# Patient Record
Sex: Female | Born: 2000 | Race: White | Hispanic: No | Marital: Single | Smoking: Never smoker
Health system: Southern US, Community
[De-identification: ages and names within clinical notes are randomized; demographics above are authoritative.]

---
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
## 2019-05-09 ENCOUNTER — Ambulatory Visit: Payer: Self-pay | Admitting: Physician Assistant

## 2019-06-06 ENCOUNTER — Encounter: Payer: Self-pay | Admitting: Physician Assistant

## 2019-06-06 ENCOUNTER — Other Ambulatory Visit: Payer: Self-pay

## 2019-06-06 ENCOUNTER — Ambulatory Visit (INDEPENDENT_AMBULATORY_CARE_PROVIDER_SITE_OTHER): Payer: Self-pay | Admitting: Physician Assistant

## 2019-06-06 DIAGNOSIS — R634 Abnormal weight loss: Secondary | ICD-10-CM

## 2019-06-06 DIAGNOSIS — F4010 Social phobia, unspecified: Secondary | ICD-10-CM

## 2019-06-06 DIAGNOSIS — F422 Mixed obsessional thoughts and acts: Secondary | ICD-10-CM

## 2019-06-06 MED ORDER — SERTRALINE HCL 50 MG PO TABS: ORAL_TABLET | ORAL | 1 refills | Status: DC

## 2019-06-06 MED ORDER — HYDROXYZINE HCL 10 MG PO TABS: 10.0000 mg | ORAL_TABLET | Freq: Three times a day (TID) | ORAL | 0 refills | Status: DC | PRN

## 2019-06-06 NOTE — Progress Notes (Signed)
[facility]  Patient ID: Virginia Bowen,  MRN: [id number]  PCP: Theresa Duty, MD  Date of Evaluation: 06/06/2019 Time spent:20 minutes  Chief Complaint:  Chief Complaint    Weight Loss      HISTORY/CURRENT STATUS: HPI Not doing well. Accompanied by mom.  Virginia Bowen was a patient of mine several years ago but has not been seen in approximately 2 years.  She was on Zoloft for anxiety and depression which was helpful.  She also took hydroxyzine for severe anxiety as needed.  She was doing well so she came off the medication.  Patient has lost about 20 # in the past year.  Has abdominal pain associated w/ n/v/d almost daily for that length of time.  Has seen her Pediatrician and then did a telehealth visit w/ GI. And has now aged out of pediatrician and is seeing Dr. Nancy Fetter as PCP at [facility]. Dr. Nancy Fetter recommended she see Psych again to make sure her problems aren't related to mental health.   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  Virginia Bowen enjoys having fun with family and friends.  She does not like to stay in bed or sleep all the time.  She likes to be busy.  She has always been an easy crier but no more so now than before.  Denies suicidal or homicidal thoughts.  Patient likes things to be in order.  She will often go back into the house to make sure that the flat iron has been turned off or that windows are locked, things of that nature.  It does not take a lot of time out of her day but it is important to her that she recheck those things.  She likes doing things in order.  She ruminates about things.  Her boyfriend tells her that she "keeps beating a dead horse."  She cannot let things go.  Gets more anxious when she has to be around a lot of people, even people she knows.  Patient denies increased energy with decreased need for sleep, no increased talkativeness, no racing thoughts, no impulsivity  or risky behaviors, no increased spending, no increased libido, no grandiosity, no increased irritability or anger, and no hallucinations.  Review of Systems  Constitutional: Positive for weight loss.  HENT: Negative.   Eyes: Negative.   Respiratory: Negative.   Cardiovascular: Negative.   Gastrointestinal: Positive for abdominal pain, diarrhea, nausea and vomiting.  Genitourinary: Negative.   Musculoskeletal: Negative.   Skin: Negative.   Neurological: Negative.   Endo/Heme/Allergies: Negative.   Psychiatric/Behavioral: The patient is nervous/anxious.    Individual Medical History/ Review of Systems: Changes? :Yes  See above.  Past medications for mental health diagnoses include: Zoloft, Hydroxyzine  Allergies: Patient has no known allergies.  Current Medications:  Current Outpatient Medications:  .  lactobacillus acidophilus (BACID) TABS tablet, Take 2 tablets by mouth 3 (three) times daily., Disp: , Rfl:  .  ondansetron (ZOFRAN) 4 MG tablet, 8 mg every 8 (eight) hours as needed. , Disp: , Rfl: 0 .  famotidine (PEPCID) 20 MG tablet, Take 1 tablet (20 mg total) by mouth 2 (two) times daily. (Patient not taking: Reported on 06/06/2019), Disp: 60 tablet, Rfl: 1 .  hydrOXYzine (ATARAX/VISTARIL) 10 MG tablet, Take 1-2 tablets (10-20 mg total) by mouth 3 (three) times daily as needed., Disp: 60 tablet, Rfl: 0 .  polyethylene glycol  powder (GLYCOLAX/MIRALAX) powder, Use as directed by md (Patient not taking: Reported on 06/06/2019), Disp: 500 g, Rfl: 0 .  sertraline (ZOLOFT) 50 MG tablet, 1/2 po q am for 6 days, then 1 po q am., Disp: 30 tablet, Rfl: 1 Medication Side Effects: none  Family Medical/ Social History: Changes? No  MENTAL HEALTH EXAM:  There were no vitals taken for this visit.There is no height or weight on file to calculate BMI.  General Appearance: Casual, Neat and Well Groomed  Eye Contact:  Good  Speech:  Clear and Coherent and Normal Rate  Volume:  Normal  Mood:   Anxious  Affect:  Anxious  Thought Process:  Goal Directed and Descriptions of Associations: Intact  Orientation:  Full (Time, Place, and Person)  Thought Content: Logical   Suicidal Thoughts:  No  Homicidal Thoughts:  No  Memory:  WNL  Judgement:  Good  Insight:  Good  Psychomotor Activity:  Fidgety  Concentration:  Concentration: Good  Recall:  Good  Fund of Knowledge: Good  Language: Good  Assets:  Desire for Improvement  ADL's:  Intact  Cognition: WNL  Prognosis:  Good    DIAGNOSES:    ICD-10-CM   1. Mixed obsessional thoughts and acts  F42.2   2. Social anxiety disorder  F40.10   3. Loss of weight  R63.4     Receiving Psychotherapy: No    RECOMMENDATIONS:  PDMP was reviewed. I spent 20 minutes with her. We discussed OCD and the anxiety that she has.  I do believe that it is contributing to her nausea, vomiting, diarrhea, and weight loss but how much of a contribution, I cannot say.  We do know that because there are serotonin receptors not only in the brain but elsewhere in the body that GI symptoms like she is experiencing can occur due to that dysregulation.  It is not that she is just "wound up or tired all the time." I recommend that we either restart the Zoloft which we at least know she tolerated well when she was on it, although she was not having these physical symptoms at that time, or start Luvox which is FDA approved for OCD.  They work the same in my experience.  The Luvox tends to make people drowsy and she does not need that side effect.  We discussed the benefits, side effects, and risks again and we all agreed to start the Zoloft. Restart Zoloft 50 mg, one half p.o. every morning for 6 days then 1 p.o. every morning. Restart hydroxyzine 10 mg, 1-2 p.o. 3 times daily as needed anxiety or sleep. Recommend counseling. She will follow-up with her PCP and GI if recommended depending on how well she responds to this treatment. Return in 4 to 6 weeks.   Melony Overly, PA-C

## 2019-06-30 ENCOUNTER — Other Ambulatory Visit: Payer: Self-pay | Admitting: Physician Assistant

## 2019-07-18 ENCOUNTER — Other Ambulatory Visit: Payer: Self-pay

## 2019-07-18 ENCOUNTER — Encounter (INDEPENDENT_AMBULATORY_CARE_PROVIDER_SITE_OTHER): Payer: Self-pay

## 2019-07-18 ENCOUNTER — Ambulatory Visit (INDEPENDENT_AMBULATORY_CARE_PROVIDER_SITE_OTHER): Payer: Self-pay | Admitting: Physician Assistant

## 2019-07-18 ENCOUNTER — Encounter: Payer: Self-pay | Admitting: Physician Assistant

## 2019-07-18 DIAGNOSIS — F4010 Social phobia, unspecified: Secondary | ICD-10-CM

## 2019-07-18 DIAGNOSIS — F422 Mixed obsessional thoughts and acts: Secondary | ICD-10-CM

## 2019-07-18 NOTE — Progress Notes (Signed)
[facility]  Patient ID: Virginia Bowen,  MRN: [id number]  PCP: Virginia Pippin, MD  Date of Evaluation: 07/18/2019 Time spent:20 minutes  Chief Complaint:  Chief Complaint    Follow-up      HISTORY/CURRENT STATUS: HPI for routine med check.  At the last visit on 06/06/2019, we restarted Zoloft.  She feels like it has helped, especially with the stomach symptoms.  She is not nearly as nauseated and having any vomiting now, and diarrhea has almost resolved.  She is eating much better and feels like she might have gained a few pounds back.  Emotionally, it is hard to tell whether it is helping much yet or not.  She thinks so but because of circumstances in her family, she cannot say for certain.  Her father was just diagnosed with colon cancer so understandably there is concern about that.  Virginia Bowen's brother just had a baby so that is been joyful change.  Still has some symptoms of OCD but does not think it is as bad.  Hydroxyzine does help but she is only taken a few.  No side effects from medications that she is aware of.  She is able to enjoy things.  Energy and motivation are good.  She is not crying easily.  Sleeps fine.  No SI/HI.  Patient denies increased energy with decreased need for sleep, no increased talkativeness, no racing thoughts, no impulsivity or risky behaviors, no increased spending, no increased libido, no grandiosity, no increased irritability or anger, and no hallucinations.  Denies dizziness, syncope, seizures, numbness, tingling, tremor, tics, unsteady gait, slurred speech, confusion. Denies muscle or joint pain, stiffness, or dystonia.  Individual Medical History/ Review of Systems: Changes? :No    Past medications for mental health diagnoses include: Zoloft, Hydroxyzine  Allergies: Patient has no known allergies.  Current Medications:  Current Outpatient Medications:  .  hydrOXYzine (ATARAX/VISTARIL) 10 MG tablet, Take 1-2 tablets (10-20  mg total) by mouth 3 (three) times daily as needed., Disp: 60 tablet, Rfl: 0 .  ondansetron (ZOFRAN) 4 MG tablet, 8 mg every 8 (eight) hours as needed. , Disp: , Rfl: 0 .  sertraline (ZOLOFT) 50 MG tablet, TAKE 1TAB DAILY, Disp: 90 tablet, Rfl: 0 .  famotidine (PEPCID) 20 MG tablet, Take 1 tablet (20 mg total) by mouth 2 (two) times daily. (Patient not taking: Reported on 06/06/2019), Disp: 60 tablet, Rfl: 1 .  lactobacillus acidophilus (BACID) TABS tablet, Take 2 tablets by mouth 3 (three) times daily. (Patient not taking: Reported on 07/18/2019), Disp: , Rfl:  .  polyethylene glycol powder (GLYCOLAX/MIRALAX) powder, Use as directed by md (Patient not taking: Reported on 06/06/2019), Disp: 500 g, Rfl: 0 Medication Side Effects: none  Family Medical/ Social History: Changes? Yes see HPI  MENTAL HEALTH EXAM:  There were no vitals taken for this visit.There is no height or weight on file to calculate BMI.  General Appearance: Casual, Neat and Well Groomed  Eye Contact:  Good  Speech:  Clear and Coherent and Normal Rate  Volume:  Normal  Mood:  Euthymic  Affect:  Appropriate  Thought Process:  Goal Directed and Descriptions of Associations: Intact  Orientation:  Full (Time, Place, and Person)  Thought Content: Logical   Suicidal Thoughts:  No  Homicidal Thoughts:  No  Memory:  WNL  Judgement:  Good  Insight:  Good  Psychomotor Activity:  Normal  Concentration:  Concentration: Good  Recall:  Good  Fund of Knowledge: Good  Language: Good  Assets:  Desire for Improvement  ADL's:  Intact  Cognition: WNL  Prognosis:  Good    DIAGNOSES:    ICD-10-CM   1. Mixed obsessional thoughts and acts  F42.2   2. Social anxiety disorder  F40.10     Receiving Psychotherapy: No    RECOMMENDATIONS:  PDMP was reviewed. I am glad she is doing better overall.  At least the physical symptoms have subsided.  Understand that with the stressors of her father's new diagnosis and having a new baby in the  family can make it hard to know whether the medications are helping a lot or not.  We decided to leave the dose of the Zoloft the same for now but if she is not seeing even more improvement within 2 weeks, call and will increase it to 75 mg.  She understands. Continue Zoloft 50 mg, 1 p.o. daily. Continue hydroxyzine 10 mg, 1-2 3 times daily as needed anxiety. Refer to [employer]. Return in 6 to 8 weeks.  Virginia Moat, PA-C

## 2019-09-02 ENCOUNTER — Ambulatory Visit (INDEPENDENT_AMBULATORY_CARE_PROVIDER_SITE_OTHER): Payer: Self-pay | Admitting: Physician Assistant

## 2019-09-02 ENCOUNTER — Encounter: Payer: Self-pay | Admitting: Physician Assistant

## 2019-09-02 ENCOUNTER — Other Ambulatory Visit: Payer: Self-pay

## 2019-09-02 DIAGNOSIS — F422 Mixed obsessional thoughts and acts: Secondary | ICD-10-CM

## 2019-09-02 DIAGNOSIS — F4010 Social phobia, unspecified: Secondary | ICD-10-CM

## 2019-09-02 MED ORDER — SERTRALINE HCL 50 MG PO TABS: 75.0000 mg | ORAL_TABLET | Freq: Every day | ORAL | 0 refills | Status: DC

## 2019-09-02 NOTE — Progress Notes (Signed)
[facility]  Patient ID: Virginia Bowen,  MRN: [id number]  PCP: Bjorn Pippin, MD  Date of Evaluation: 09/02/2019 Time spent:20 minutes  Chief Complaint:  Chief Complaint    Anxiety      HISTORY/CURRENT STATUS: HPI for routine med check.  Is doing better since being on the Zoloft but feels like she could do even better.  The OCD symptoms are not as big of a deal.  It was never serious anyway.  She will just kind of scanning the room, checking to make sure that all the windows are closed and locked, things like that.  But when she is at a client's house dog sitting, she will double check several times to make sure that the home is locked and the gate is locked so that her pets will not get out.  She is more able to begin public and not be as anxious.  Still feels like she could do better though.  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  Hydroxyzine does help but she is only taken a few.  No side effects from medications that she is aware of.  She is able to enjoy things.  Energy and motivation are good.  She is not crying easily.  Sleeps fine.  No SI/HI.  Patient denies increased energy with decreased need for sleep, no increased talkativeness, no racing thoughts, no impulsivity or risky behaviors, no increased spending, no increased libido, no grandiosity, no increased irritability or anger, and no hallucinations.  Denies dizziness, syncope, seizures, numbness, tingling, tremor, tics, unsteady gait, slurred speech, confusion. Denies muscle or joint pain, stiffness, or dystonia.  Individual Medical History/ Review of Systems: Changes? :No    Past medications for mental health diagnoses include: Zoloft, Hydroxyzine  Allergies: Patient has no known allergies.  Current Medications:  Current Outpatient Medications:    hydrOXYzine (ATARAX/VISTARIL) 10 MG tablet, Take 1-2 tablets (10-20 mg total) by mouth 3 (three)  times daily as needed., Disp: 60 tablet, Rfl: 0   sertraline (ZOLOFT) 50 MG tablet, Take 1.5 tablets (75 mg total) by mouth daily., Disp: 135 tablet, Rfl: 0   famotidine (PEPCID) 20 MG tablet, Take 1 tablet (20 mg total) by mouth 2 (two) times daily. (Patient not taking: Reported on 06/06/2019), Disp: 60 tablet, Rfl: 1   ondansetron (ZOFRAN) 4 MG tablet, 8 mg every 8 (eight) hours as needed.  (Patient not taking: Reported on 09/02/2019), Disp: , Rfl: 0   polyethylene glycol powder (GLYCOLAX/MIRALAX) powder, Use as directed by md (Patient not taking: Reported on 06/06/2019), Disp: 500 g, Rfl: 0 Medication Side Effects: none  Family Medical/ Social History: Changes? Yes see HPI  MENTAL HEALTH EXAM:  There were no vitals taken for this visit.There is no height or weight on file to calculate BMI.  General Appearance: Casual, Neat and Well Groomed  Eye Contact:  Good  Speech:  Clear and Coherent and Normal Rate  Volume:  Normal  Mood:  Euthymic  Affect:  Appropriate  Thought Process:  Goal Directed and Descriptions of Associations: Intact  Orientation:  Full (Time, Place, and Person)  Thought Content: Logical   Suicidal Thoughts:  No  Homicidal Thoughts:  No  Memory:  WNL  Judgement:  Good  Insight:  Good  Psychomotor Activity:  Normal  Concentration:  Concentration: Good and Attention Span: Good  Recall:  Good  Fund of Knowledge: Good  Language: Good  Assets:  Desire for  Improvement  ADL's:  Intact  Cognition: WNL  Prognosis:  Good    DIAGNOSES:    ICD-10-CM   1. Social anxiety disorder  F40.10   2. Mixed obsessional thoughts and acts  F42.2     Receiving Psychotherapy: No    RECOMMENDATIONS:  PDMP was reviewed. Provided 20 minutes of face-to-face time during this encounter. We discussed increasing the Zoloft.  She states she is approximately 50% better than before going on the Zoloft.  I believe we can get more improvement than that.  She would like to try the  increase. We also discussed counseling.  She does not know what she would talk about with a counselor so prefers not to go right now.  States she does not have any tragedies or major stressors going on so she has nothing to discuss.  If that changes, she will seek a help of a counselor. Increase Zoloft 50 mg, to 1.5 pills daily. Continue hydroxyzine 10 mg, 1-2 3 times daily as needed anxiety. Return in 6 weeks.  Melony Overly, PA-C

## 2019-09-25 ENCOUNTER — Telehealth: Payer: Self-pay | Admitting: Physician Assistant

## 2019-09-25 NOTE — Telephone Encounter (Signed)
Pt's mom Natalia Leatherwood called and Sertroline needs a dosing change to 75 mg as the pharmacy has 50 mg. She needs a refill sent to [facility] on [address], [phone].

## 2019-09-26 ENCOUNTER — Other Ambulatory Visit: Payer: Self-pay

## 2019-09-26 MED ORDER — SERTRALINE HCL 50 MG PO TABS: 75.0000 mg | ORAL_TABLET | Freq: Every day | ORAL | 0 refills | Status: DC

## 2019-09-26 NOTE — Telephone Encounter (Signed)
Rx sent today, provider had no print and he did not get sent to pharmacy previously at office visit.

## 2019-10-14 ENCOUNTER — Encounter: Payer: Self-pay | Admitting: Physician Assistant

## 2019-10-14 ENCOUNTER — Telehealth (INDEPENDENT_AMBULATORY_CARE_PROVIDER_SITE_OTHER): Payer: Self-pay | Admitting: Physician Assistant

## 2019-10-14 DIAGNOSIS — F422 Mixed obsessional thoughts and acts: Secondary | ICD-10-CM

## 2019-10-14 DIAGNOSIS — F4010 Social phobia, unspecified: Secondary | ICD-10-CM

## 2019-10-14 NOTE — Progress Notes (Signed)
[facility]  Patient ID: Virginia Bowen,  MRN: [id number]  PCP: Bjorn Pippin, MD  Date of Evaluation: 10/14/2019 Time spent:20 minutes  Chief Complaint:  Chief Complaint    Anxiety     Virtual Visit via Telehealth  I connected with patient by telephone, with their informed consent, and verified patient privacy and that I am speaking with the correct person using two identifiers.  I am private, in my office and the patient is at home.  I discussed the limitations, risks, security and privacy concerns of performing an evaluation and management service by telephone and the availability of in person appointments. I also discussed with the patient that there may be a patient responsible charge related to this service. The patient expressed understanding and agreed to proceed.   I discussed the assessment and treatment plan with the patient. The patient was provided an opportunity to ask questions and all were answered. The patient agreed with the plan and demonstrated an understanding of the instructions.   The patient was advised to call back or seek an in-person evaluation if the symptoms worsen or if the condition fails to improve as anticipated.  I provided 20 minutes of non-face-to-face time during this encounter.  HISTORY/CURRENT STATUS: HPI for 6-week med check.  At the last visit, we increased the Zoloft to help with the ruminating thoughts and mild compulsions of checking things in the home before she left, and also for social phobia.  States things are better in both areas.    She is not as nervous to go out and be around people.  She is able to enjoy things.  Energy and motivation are good.  She is not isolating.  Is excited that she will turn 19 in a few weeks and is going to the beach for her [place].  Not crying easily.  She would sleep well except for the fact that she takes care of her 2 very young nephews 2 and under and she is not getting much  sleep at night with them, or in the daytime because they are up all day.  Denies suicidal or homicidal thoughts.  Continues to have mild ruminating thoughts.  But states it is much better than before.  "I feel a ton better than I did a year ago."  She does still have to check door locks or make sure the stove is off before she feels comfortable leaving the house.  She does not spend as much time on it as she used to though.  Hydroxyzine does continue to help when needed.  She does not take it very often.  It does not make her sleepy, and just calms the anxiety down.  Patient denies increased energy with decreased need for sleep, no increased talkativeness, no racing thoughts, no impulsivity or risky behaviors, no increased spending, no increased libido, no grandiosity, no increased irritability or anger, and no hallucinations.  Denies dizziness, syncope, seizures, numbness, tingling, tremor, tics, unsteady gait, slurred speech, confusion. Denies muscle or joint pain, stiffness, or dystonia.  Individual Medical History/ Review of Systems: Changes? :No    Past medications for mental health diagnoses include: Zoloft, Hydroxyzine  Allergies: Patient has no known allergies.  Current Medications:  Current Outpatient Medications:  .  hydrOXYzine (ATARAX/VISTARIL) 10 MG tablet, Take 1-2 tablets (10-20 mg total) by mouth 3 (three) times daily as needed., Disp: 60 tablet, Rfl: 0 .  sertraline (ZOLOFT) 50 MG tablet, Take 1.5 tablets (75 mg total) by mouth daily.,  Disp: 135 tablet, Rfl: 0 .  ondansetron (ZOFRAN) 4 MG tablet, 8 mg every 8 (eight) hours as needed.  (Patient not taking: Reported on 09/02/2019), Disp: , Rfl: 0 Medication Side Effects: none  Family Medical/ Social History: Changes? Yes see HPI  MENTAL HEALTH EXAM:  There were no vitals taken for this visit.There is no height or weight on file to calculate BMI.  General Appearance: Unable to assess  Eye Contact:  Unable to assess  Speech:   Clear and Coherent and Normal Rate  Volume:  Normal  Mood:  Euthymic  Affect:  Unable to assess  Thought Process:  Goal Directed and Descriptions of Associations: Intact  Orientation:  Full (Time, Place, and Person)  Thought Content: Logical   Suicidal Thoughts:  No  Homicidal Thoughts:  No  Memory:  WNL  Judgement:  Good  Insight:  Good  Psychomotor Activity:  Unable to assess  Concentration:  Concentration: Good and Attention Span: Good  Recall:  Good  Fund of Knowledge: Good  Language: Good  Assets:  Desire for Improvement  ADL's:  Intact  Cognition: WNL  Prognosis:  Good    DIAGNOSES:    ICD-10-CM   1. Social anxiety disorder  F40.10   2. Mixed obsessional thoughts and acts  F42.2     Receiving Psychotherapy: No    RECOMMENDATIONS:  PDMP was reviewed. I provided 20 minutes of nonface-to-face time during this encounter. I am glad to see her doing so much better! We discussed the Zoloft in that the dose can be increased much more if needed.  At this point, neither of [place] feel that is necessary but she will contact the office if she feels the need before the next appointment.   Continue Zoloft 50 mg, 1.5 pills daily. Continue hydroxyzine 10 mg, 1-2 3 times daily as needed anxiety. Return in 3 months.    Melony Overly, PA-C

## 2019-12-17 ENCOUNTER — Other Ambulatory Visit: Payer: Self-pay | Admitting: Physician Assistant

## 2020-01-14 ENCOUNTER — Ambulatory Visit: Payer: Self-pay | Admitting: Physician Assistant

## 2020-03-01 ENCOUNTER — Ambulatory Visit: Payer: Self-pay | Admitting: Physician Assistant

## 2020-03-15 ENCOUNTER — Other Ambulatory Visit: Payer: Self-pay | Admitting: Physician Assistant

## 2020-04-20 ENCOUNTER — Ambulatory Visit (INDEPENDENT_AMBULATORY_CARE_PROVIDER_SITE_OTHER): Payer: Self-pay | Admitting: Physician Assistant

## 2020-04-20 ENCOUNTER — Other Ambulatory Visit: Payer: Self-pay

## 2020-04-20 ENCOUNTER — Encounter: Payer: Self-pay | Admitting: Physician Assistant

## 2020-04-20 DIAGNOSIS — F422 Mixed obsessional thoughts and acts: Secondary | ICD-10-CM

## 2020-04-20 DIAGNOSIS — N943 Premenstrual tension syndrome: Secondary | ICD-10-CM

## 2020-04-20 DIAGNOSIS — F4010 Social phobia, unspecified: Secondary | ICD-10-CM

## 2020-04-20 MED ORDER — HYDROXYZINE HCL 10 MG PO TABS: 10.0000 mg | ORAL_TABLET | Freq: Three times a day (TID) | ORAL | 5 refills | Status: DC | PRN

## 2020-04-20 NOTE — Progress Notes (Signed)
[facility]  Patient ID: Virginia Bowen,  MRN: [id number]  PCP: Bjorn Pippin, MD  Date of Evaluation: 04/20/2020 Time spent:20 minutes  Chief Complaint:  Chief Complaint    Anxiety; Depression      HISTORY/CURRENT STATUS: For routine med check.   Is doing very well.  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  She likes it but is considering changing.  She does enjoy things.  Denies decreased energy or motivation.  Appetite has not changed.  No extreme sadness, tearfulness, or feelings of hopelessness. Denies suicidal or homicidal thoughts.  Anxiety is much better.  She does need the hydroxyzine on occasion but not very often.  She can tell if she does not take the Zoloft.  Feels more anxious then.  Not having ruminating thoughts near as often as she used to.  Does report PMS, cries for a few days before menses and not sure why.  We had talked about increasing the Zoloft slightly a couple of years ago, just for the PMS.  She is not sure if she tried it or not and I do not have her old paper chart in front of me.    Patient denies increased energy with decreased need for sleep, no increased talkativeness, no racing thoughts, no impulsivity or risky behaviors, no increased spending, no increased libido, no grandiosity, no increased irritability or anger, and no hallucinations.  Denies dizziness, syncope, seizures, numbness, tingling, tremor, tics, unsteady gait, slurred speech, confusion. Denies muscle or joint pain, stiffness, or dystonia.  Individual Medical History/ Review of Systems: Changes? :No    Past medications for mental health diagnoses include: Zoloft, Hydroxyzine  Allergies: Patient has no known allergies.  Current Medications:  Current Outpatient Medications:  .  sertraline (ZOLOFT) 50 MG tablet, TAKE 1 AND 1/2 TABLETS BY MOUTH DAILY, Disp: 135 tablet, Rfl: 0 .  hydrOXYzine  (ATARAX/VISTARIL) 10 MG tablet, Take 1-2 tablets (10-20 mg total) by mouth 3 (three) times daily as needed., Disp: 60 tablet, Rfl: 5 .  ondansetron (ZOFRAN) 4 MG tablet, 8 mg every 8 (eight) hours as needed.  (Patient not taking: No sig reported), Disp: , Rfl: 0 Medication Side Effects: none  Family Medical/ Social History: Changes? Yes see HPI  MENTAL HEALTH EXAM:  There were no vitals taken for this visit.There is no height or weight on file to calculate BMI.  General Appearance: Casual, Neat and Well Groomed  Eye Contact:  Good  Speech:  Clear and Coherent and Normal Rate  Volume:  Normal  Mood:  Euthymic  Affect:  Appropriate  Thought Process:  Goal Directed and Descriptions of Associations: Intact  Orientation:  Full (Time, Place, and Person)  Thought Content: Logical   Suicidal Thoughts:  No  Homicidal Thoughts:  No  Memory:  WNL  Judgement:  Good  Insight:  Good  Psychomotor Activity:  Normal  Concentration:  Concentration: Good and Attention Span: Good  Recall:  Good  Fund of Knowledge: Good  Language: Good  Assets:  Desire for Improvement  ADL's:  Intact  Cognition: WNL  Prognosis:  Good    DIAGNOSES:    ICD-10-CM   1. Mixed obsessional thoughts and acts  F42.2   2. Social anxiety disorder  F40.10   3. PMS (premenstrual syndrome)  N94.3     Receiving Psychotherapy: No    RECOMMENDATIONS:  PDMP was reviewed. I provided 20 minutes of  face-to-face time during this encounter, including time spent in review of records and charting. For the PMS, she is aware that she can take another 25 mg of Zoloft for up to 5 days premenstrually.  Once menses begins, go back down to 75 mg daily.  She verbalizes understanding. Continue Zoloft 50 mg, 1.5 pills daily. Continue hydroxyzine 10 mg, 1-2 3 times daily as needed anxiety. Return in 6 months.    Melony Overly, PA-C

## 2020-06-19 ENCOUNTER — Other Ambulatory Visit: Payer: Self-pay | Admitting: Physician Assistant

## 2020-09-29 ENCOUNTER — Other Ambulatory Visit: Payer: Self-pay | Admitting: Physician Assistant

## 2020-10-13 ENCOUNTER — Ambulatory Visit: Payer: Self-pay | Admitting: Physician Assistant

## 2020-12-28 ENCOUNTER — Telehealth: Payer: Self-pay | Admitting: Physician Assistant

## 2020-12-28 ENCOUNTER — Other Ambulatory Visit: Payer: Self-pay

## 2020-12-28 MED ORDER — SERTRALINE HCL 50 MG PO TABS: 75.0000 mg | ORAL_TABLET | Freq: Every day | ORAL | 0 refills | Status: DC

## 2020-12-28 MED ORDER — HYDROXYZINE HCL 10 MG PO TABS: 10.0000 mg | ORAL_TABLET | Freq: Three times a day (TID) | ORAL | 0 refills | Status: DC | PRN

## 2020-12-28 NOTE — Telephone Encounter (Signed)
Rx sent 

## 2020-12-28 NOTE — Telephone Encounter (Signed)
Pt mom called and made an appt for 1/125 w/teresa. She needs refills on her zoloft and hydrooxyzine. Please send scripts to [facility] in [place]. [address]. Phone number [phone]

## 2020-12-31 ENCOUNTER — Other Ambulatory Visit: Payer: Self-pay | Admitting: Physician Assistant

## 2021-02-10 ENCOUNTER — Other Ambulatory Visit: Payer: Self-pay

## 2021-02-10 ENCOUNTER — Ambulatory Visit (INDEPENDENT_AMBULATORY_CARE_PROVIDER_SITE_OTHER): Payer: Self-pay | Admitting: Physician Assistant

## 2021-02-10 ENCOUNTER — Encounter: Payer: Self-pay | Admitting: Physician Assistant

## 2021-02-10 DIAGNOSIS — F4010 Social phobia, unspecified: Secondary | ICD-10-CM

## 2021-02-10 DIAGNOSIS — F331 Major depressive disorder, recurrent, moderate: Secondary | ICD-10-CM

## 2021-02-10 DIAGNOSIS — F422 Mixed obsessional thoughts and acts: Secondary | ICD-10-CM

## 2021-02-10 MED ORDER — TRAZODONE HCL 100 MG PO TABS: 50.0000 mg | ORAL_TABLET | Freq: Every evening | ORAL | 1 refills | Status: DC | PRN

## 2021-02-10 MED ORDER — HYDROXYZINE HCL 10 MG PO TABS: 10.0000 mg | ORAL_TABLET | Freq: Three times a day (TID) | ORAL | 1 refills | Status: DC | PRN

## 2021-02-10 NOTE — Progress Notes (Signed)
[facility]  Patient ID: Virginia Bowen,  MRN: [id number]  PCP: Theresa Duty, MD  Date of Evaluation: 02/10/2021 Time spent:20 minutes  Chief Complaint:  Chief Complaint   Anxiety; Depression; Follow-up      HISTORY/CURRENT STATUS: For routine med check. Hasn't been since since 03/2020.   Has been more anxious and depressed over the past few months. Hard time enjoying things, is tired all the time, wants to lay around, cries easily but not worse than her normal, personal hygiene is normal. 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 Feels lonely, empty, but even though she has friends and family, she doesn't want to do anything like go on dates w/ her BF for example. No SI/HI.   Not sleeping well, more of MNA than trouble falling asleep. Mostly drinks water, sometimes has 2 caffeinated beverages per day, but not right before bed. Not on electronic devices within 2 hours of going to bed.  She has tried hydroxyzine 10 mg and it does not help.  Melatonin does not help either.  Dose is unknown.  Has been more anxious, generalized sense of unease.  Not having panic attacks.  She does take the Xanax occasionally but not often.  It is helpful.  As far as the PMS goes, some months are good and others are not.  She does not always take the higher dose of Zoloft the week before.  It is hard to know when to take it.  Patient denies increased energy with decreased need for sleep, no increased talkativeness, no racing thoughts, no impulsivity or risky behaviors, no increased spending, no increased libido, no grandiosity, no increased irritability or anger, and no hallucinations.  Denies dizziness, syncope, seizures, numbness, tingling, tremor, tics, unsteady gait, slurred speech, confusion. Denies muscle or joint pain, stiffness, or dystonia.  Individual Medical History/  Review of Systems: Changes? :Yes  stomach problems.  Seeing her PCP.  Past medications for mental health diagnoses include: Zoloft, Hydroxyzine  Allergies: Patient has no known allergies.  Current Medications:  Current Outpatient Medications:    Melatonin 3-10 MG TABS, Take by mouth., Disp: , Rfl:    ondansetron (ZOFRAN) 4 MG tablet, 8 mg every 8 (eight) hours as needed., Disp: , Rfl: 0   sertraline (ZOLOFT) 50 MG tablet, Take 1.5 tablets (75 mg total) by mouth daily., Disp: 135 tablet, Rfl: 0   traZODone (DESYREL) 100 MG tablet, Take 0.5-1 tablets (50-100 mg total) by mouth at bedtime as needed for sleep., Disp: 30 tablet, Rfl: 1   hydrOXYzine (ATARAX) 10 MG tablet, Take 1-2 tablets (10-20 mg total) by mouth 3 (three) times daily as needed., Disp: 90 tablet, Rfl: 1 Medication Side Effects: none  Family Medical/ Social History: Changes? Yes see HPI  MENTAL HEALTH EXAM:  There were no vitals taken for this visit.There is no height or weight on file to calculate BMI.  General Appearance: Casual, Neat and Well Groomed  Eye Contact:  Good  Speech:  Clear and Coherent and Normal Rate  Volume:  Normal  Mood:  Depressed  Affect:  Congruent and Depressed  Thought Process:  Goal Directed and Descriptions of Associations: Intact  Orientation:  Full (Time, Place, and Person)  Thought Content: Logical   Suicidal Thoughts:  No  Homicidal Thoughts:  No  Memory:  WNL  Judgement:  Good  Insight:  Good  Psychomotor Activity:  Normal  Concentration:  Concentration: Good and Attention Span: Good  Recall:  Good  Fund of Knowledge: Good  Language: Good  Assets:  Desire for Improvement  ADL's:  Intact  Cognition: WNL  Prognosis:  Good    DIAGNOSES:    ICD-10-CM   1. Major depressive disorder, recurrent episode, moderate (HCC)  F33.1     2. Mixed obsessional thoughts and acts  F42.2     3. Social anxiety disorder  F40.10        Receiving Psychotherapy: No    RECOMMENDATIONS:   PDMP was reviewed.  No results available. I provided 20 minutes of face to face time during this encounter, including time spent before and after the visit in records review, medical decision making, counseling pertinent to today's visit, and charting.  We discussed different options and the first choice is to increase the Zoloft.  She has responded well to it in the past, it may have pooped out now.  We decided not to focus on the PMS right now but later on after we know what the Zoloft dose is going to end up being then we can increase the week before menses begins.  She verbalizes understanding. Sleep hygiene was discussed.  I recommend that she take the hydroxyzine 20 to 30 mg around 30 minutes to an hour before bed and see if that will be helpful.  If it is not then recommend trying trazodone.  I have sent that prescription in and she will get it if the increase hydroxyzine dose is ineffective.  Benefits risk and side effects of the trazodone were discussed and she accepts. Continue hydroxyzine 10 mg, 1-2 3 times daily as needed anxiety. Increase Zoloft to 100 mg for 7 to 10 days then increase to 150 mg.  She has a lot of the 50 mg pills so will take those until she needs a new prescription. Start trazodone 100 mg, 1/2-1 p.o. nightly as needed sleep. Return in 4-6 weeks.    Donnal Moat, PA-C

## 2021-02-10 NOTE — Patient Instructions (Signed)
Increase the Zoloft 50 mg to 2 pills (100 mg) for 7-10 days, then increase to 3 pills (150 mg)

## 2021-03-14 ENCOUNTER — Other Ambulatory Visit: Payer: Self-pay | Admitting: Physician Assistant

## 2021-03-14 NOTE — Telephone Encounter (Signed)
Appt on wednesday

## 2021-03-16 ENCOUNTER — Encounter: Payer: Self-pay | Admitting: Physician Assistant

## 2021-03-16 ENCOUNTER — Other Ambulatory Visit: Payer: Self-pay

## 2021-03-16 ENCOUNTER — Ambulatory Visit (INDEPENDENT_AMBULATORY_CARE_PROVIDER_SITE_OTHER): Payer: Self-pay | Admitting: Physician Assistant

## 2021-03-16 DIAGNOSIS — F4010 Social phobia, unspecified: Secondary | ICD-10-CM

## 2021-03-16 DIAGNOSIS — F331 Major depressive disorder, recurrent, moderate: Secondary | ICD-10-CM

## 2021-03-16 DIAGNOSIS — F422 Mixed obsessional thoughts and acts: Secondary | ICD-10-CM

## 2021-03-16 MED ORDER — SERTRALINE HCL 100 MG PO TABS: 200.0000 mg | ORAL_TABLET | Freq: Every day | ORAL | 1 refills | Status: DC

## 2021-03-16 NOTE — Progress Notes (Signed)
[facility]  Patient ID: Virginia Bowen,  MRN: [id number]  PCP: Bjorn Pippin, MD  Date of Evaluation: 03/16/2021 Time spent:20 minutes  Chief Complaint:  Chief Complaint   Depression; Anxiety; Insomnia; Follow-up     HISTORY/CURRENT STATUS: For routine med check.   We increased Zoloft 6 weeks ago. It has helped the depression, but not the anxiety.  Feels overwhelmed, caged animal feeling.  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  But she wants to do something else but does not know what that is.  She wants to live somewhere else but she has nowhere else to go.  Feels like she has pent up energy and is not able to do anything with it.  Occasionally gets palpitations.  No chest pain or tightness.  No shortness of breath.  The anxiety has gotten to the point where she does not want to do anything outside of her normal routine at home.  Even going out with friends later on today makes her anxious.  This is someone she has known for years and the restaurant they are going to is very familiar to her but she is still nervous about it.  Patient denies loss of interest in usual activities and is able to enjoy things.  Denies decreased energy or motivation.  Appetite has not changed.  No extreme sadness, tearfulness, or feelings of hopelessness.  Denies any changes in concentration, making decisions or remembering things.  Sleeps fairly well.  Not using the trazodone very often.  Denies suicidal or homicidal thoughts.  Patient denies increased energy with decreased need for sleep, no increased talkativeness, no racing thoughts, no impulsivity or risky behaviors, no increased spending, no increased libido, no grandiosity, no increased irritability or anger, and no hallucinations.  Denies dizziness, syncope, seizures, numbness, tingling, tremor, tics, unsteady gait, slurred speech, confusion. Denies muscle or joint pain, stiffness, or  dystonia.  Individual Medical History/ Review of Systems: Changes? :Yes   still having stomach problems.  She has an appointment with GI on 04/06/2021  Past medications for mental health diagnoses include: Zoloft, Hydroxyzine  Allergies: Patient has no known allergies.  Current Medications:  Current Outpatient Medications:    hydrOXYzine (ATARAX) 10 MG tablet, Take 1-2 tablets (10-20 mg total) by mouth 3 (three) times daily as needed., Disp: 90 tablet, Rfl: 1   ondansetron (ZOFRAN) 4 MG tablet, 8 mg every 8 (eight) hours as needed., Disp: , Rfl: 0   sertraline (ZOLOFT) 100 MG tablet, Take 2 tablets (200 mg total) by mouth daily., Disp: 180 tablet, Rfl: 1   traZODone (DESYREL) 100 MG tablet, Take 0.5-1 tablets (50-100 mg total) by mouth at bedtime as needed for sleep., Disp: 30 tablet, Rfl: 1   Melatonin 3-10 MG TABS, Take by mouth. (Patient not taking: Reported on 03/16/2021), Disp: , Rfl:  Medication Side Effects: none  Family Medical/ Social History: Changes?  No  MENTAL HEALTH EXAM:  There were no vitals taken for this visit.There is no height or weight on file to calculate BMI.  General Appearance: Casual, Neat and Well Groomed  Eye Contact:  Good  Speech:  Clear and Coherent and Normal Rate  Volume:  Normal  Mood:  Anxious  Affect:  Anxious  Thought Process:  Goal Directed and Descriptions of Associations: Intact  Orientation:  Full (Time, Place, and Person)  Thought Content: Logical   Suicidal Thoughts:  No  Homicidal Thoughts:  No  Memory:  WNL  Judgement:  Good  Insight:  Good  Psychomotor Activity:  Normal  Concentration:  Concentration: Good and Attention Span: Good  Recall:  Good  Fund of Knowledge: Good  Language: Good  Assets:  Desire for Improvement  ADL's:  Intact  Cognition: WNL  Prognosis:  Good    DIAGNOSES:    ICD-10-CM   1. Social anxiety disorder  F40.10     2. Mixed obsessional thoughts and acts  F42.2     3. Major depressive disorder,  recurrent episode, moderate (HCC)  F33.1         Receiving Psychotherapy: No    RECOMMENDATIONS:  PDMP was reviewed.  No results available. I provided 20 minutes of face to face time during this encounter, including time spent before and after the visit in records review, medical decision making, counseling pertinent to today's visit, and charting.  Recommend increasing the hydroxyzine and Zoloft.  As long as the hydroxyzine is not causing sedation, she can take it 3 times a day even if she is going out to run errands.  Increase hydroxyzine 10 mg, to 2-3 p.o. 3 times daily as needed.   Increase Zoloft to 200 mg daily.  Continue trazodone 100 mg, 1/2-1 p.o. nightly as needed sleep. Recommend [employer], [employer], or [employer].  Also [facility]. Return in 6 weeks.    Melony Overly, PA-C

## 2021-04-06 ENCOUNTER — Other Ambulatory Visit: Payer: Self-pay | Admitting: Gastroenterology

## 2021-04-06 DIAGNOSIS — R195 Other fecal abnormalities: Secondary | ICD-10-CM

## 2021-04-06 DIAGNOSIS — R1011 Right upper quadrant pain: Secondary | ICD-10-CM

## 2021-04-27 ENCOUNTER — Ambulatory Visit: Payer: Self-pay | Admitting: Physician Assistant

## 2021-05-04 ENCOUNTER — Other Ambulatory Visit: Payer: BLUE CROSS/BLUE SHIELD

## 2021-05-12 ENCOUNTER — Ambulatory Visit (INDEPENDENT_AMBULATORY_CARE_PROVIDER_SITE_OTHER): Payer: Self-pay | Admitting: Physician Assistant

## 2021-05-12 ENCOUNTER — Encounter: Payer: Self-pay | Admitting: Physician Assistant

## 2021-05-12 DIAGNOSIS — F4010 Social phobia, unspecified: Secondary | ICD-10-CM

## 2021-05-12 DIAGNOSIS — G4700 Insomnia, unspecified: Secondary | ICD-10-CM

## 2021-05-12 DIAGNOSIS — F3341 Major depressive disorder, recurrent, in partial remission: Secondary | ICD-10-CM

## 2021-05-12 MED ORDER — MIRTAZAPINE 15 MG PO TABS: 7.5000 mg | ORAL_TABLET | Freq: Every day | ORAL | 1 refills | Status: DC

## 2021-05-12 NOTE — Progress Notes (Signed)
[facility] ? ?Patient ID: Virginia Bowen,  ?MRN: [id number] ? ?PCP: Theresa Duty, MD ? ?Date of Evaluation: 05/12/2021 ?Time spent:20 minutes ? ?Chief Complaint:  ?Chief Complaint   ?Anxiety; Depression; Insomnia; Follow-up ?  ? ? ?HISTORY/CURRENT STATUS: ?For routine med check.  ? ?Trouble sleeping sometimes, wakes up at 3:00 and can't go back to sleep.  This has been going on for several months.  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  When she takes 25 mg it helps her go to sleep but not stay asleep.  She is usually only getting about 5 hours per night recently.  Melatonin does not help. ? ?Zoloft was increased at last visit. It's helped. Patient denies loss of interest in usual activities and is able to enjoy things.  Denies decreased energy or motivation.  Appetite has not changed.  No extreme sadness, tearfulness, or feelings of hopelessness.  Denies any changes in concentration, making decisions or remembering things.  Does get anxious but it is mostly controlled.  Hydroxyzine is helpful for that.  Denies suicidal or homicidal thoughts. ? ?Patient denies increased energy with decreased need for sleep, no increased talkativeness, no racing thoughts, no impulsivity or risky behaviors, no increased spending, no increased libido, no grandiosity, no increased irritability or anger, and no hallucinations. ? ?Denies dizziness, syncope, seizures, numbness, tingling, tremor, tics, unsteady gait, slurred speech, confusion. Denies muscle or joint pain, stiffness, or dystonia. ? ?Individual Medical History/ Review of Systems: Changes? :Yes    she saw a GI last month and was given pantoprazole.  She has not been taking it though, it made her feel "weird." ? ?Past medications for mental health diagnoses include: ?Zoloft, Hydroxyzine, Trazodone, Melatonin ? ?Allergies: Patient has no  known allergies. ? ?Current Medications:  ?Current Outpatient Medications:  ?  hydrOXYzine (ATARAX) 10 MG tablet, Take 1-2 tablets (10-20 mg total) by mouth 3 (three) times daily as needed., Disp: 90 tablet, Rfl: 1 ?  mirtazapine (REMERON) 15 MG tablet, Take 0.5-1 tablets (7.5-15 mg total) by mouth at bedtime., Disp: 30 tablet, Rfl: 1 ?  ondansetron (ZOFRAN) 4 MG tablet, 8 mg every 8 (eight) hours as needed., Disp: , Rfl: 0 ?  pantoprazole (PROTONIX) 40 MG tablet, Take 1 tablet 30 minutes before meal on an empty stomach, Disp: , Rfl:  ?  sertraline (ZOLOFT) 100 MG tablet, Take 2 tablets (200 mg total) by mouth daily., Disp: 180 tablet, Rfl: 1 ?  Melatonin 3-10 MG TABS, Take by mouth. (Patient not taking: Reported on 03/16/2021), Disp: , Rfl:  ?Medication Side Effects: none ? ?Family Medical/ Social History: Changes?  No ? ?MENTAL HEALTH EXAM: ? ?There were no vitals taken for this visit.There is no height or weight on file to calculate BMI.  ?General Appearance: Casual, Neat and Well Groomed  ?Eye Contact:  Good  ?Speech:  Clear and Coherent and Normal Rate  ?Volume:  Normal  ?Mood:  Euthymic  ?Affect:  Congruent  ?Thought Process:  Goal Directed and Descriptions of Associations: Intact  ?Orientation:  Full (Time, Place, and Person)  ?Thought Content: Logical   ?Suicidal Thoughts:  No  ?Homicidal Thoughts:  No  ?Memory:  WNL  ?Judgement:  Good  ?Insight:  Good  ?Psychomotor Activity:  Normal  ?Concentration:  Concentration: Good and Attention Span: Good  ?Recall:  Good  ?  Fund of Knowledge: Good  ?Language: Good  ?Assets:  Desire for Improvement  ?ADL's:  Intact  ?Cognition: WNL  ?Prognosis:  Good  ? ? ?DIAGNOSES:  ?  ICD-10-CM   ?1. Recurrent major depressive disorder, in partial remission ([place])  F33.41   ?  ?2. Social anxiety disorder  F40.10   ?  ?3. Insomnia, unspecified type  G47.00   ?  ? ? ?Receiving Psychotherapy: No  ? ? ?RECOMMENDATIONS:  ?PDMP was reviewed.  No results available. ?I provided 20 minutes of  face to face time during this encounter, including time spent before and after the visit in records review, medical decision making, counseling pertinent to today's visit, and charting.  ?I am glad she is doing better with the anxiety and depression.  No changes in Zoloft and hydroxyzine. ?Recommend changing trazodone to mirtazapine.  Benefits, risks and side effects were discussed and she accepts. ?Continue hydroxyzine 10 mg, 1-2 nightly as needed anxiety. ?Start mirtazapine 15 mg, 1/2-1 nightly as needed. ?Continue Zoloft 100 mg, 2 p.o. daily. ?Return in 6 months. ? ?  Donnal Moat, PA-C  ?

## 2021-07-18 ENCOUNTER — Telehealth: Payer: Self-pay | Admitting: Physician Assistant

## 2021-07-18 NOTE — Telephone Encounter (Signed)
Pt lvm at 12 noon. She has a question for teresa. Please call her at [phone]

## 2021-07-18 NOTE — Telephone Encounter (Signed)
I think she needs to bring in the summons she got in the mail, then I'll write letter.

## 2021-07-18 NOTE — Telephone Encounter (Signed)
Patient notified we need copy of summons.

## 2021-07-18 NOTE — Telephone Encounter (Signed)
Patient called to ask for a letter to excuse her from jury duty due to her anxiety. She was driving and not able to give me an exact date, but thought it was August 10th.

## 2021-07-21 ENCOUNTER — Telehealth: Payer: Self-pay | Admitting: Physician Assistant

## 2021-07-21 NOTE — Telephone Encounter (Signed)
Called Pt to pick up jury duty letter. Will pick up 6/23

## 2021-08-31 ENCOUNTER — Other Ambulatory Visit: Payer: Self-pay | Admitting: Physician Assistant

## 2021-11-16 ENCOUNTER — Ambulatory Visit: Payer: Self-pay | Admitting: Physician Assistant

## 2021-12-01 ENCOUNTER — Ambulatory Visit (INDEPENDENT_AMBULATORY_CARE_PROVIDER_SITE_OTHER): Payer: Self-pay | Admitting: Physician Assistant

## 2021-12-01 ENCOUNTER — Encounter: Payer: Self-pay | Admitting: Physician Assistant

## 2021-12-01 DIAGNOSIS — F4010 Social phobia, unspecified: Secondary | ICD-10-CM

## 2021-12-01 DIAGNOSIS — R5381 Other malaise: Secondary | ICD-10-CM

## 2021-12-01 DIAGNOSIS — Z79899 Other long term (current) drug therapy: Secondary | ICD-10-CM

## 2021-12-01 DIAGNOSIS — F3341 Major depressive disorder, recurrent, in partial remission: Secondary | ICD-10-CM

## 2021-12-01 DIAGNOSIS — R5383 Other fatigue: Secondary | ICD-10-CM

## 2021-12-01 MED ORDER — SERTRALINE HCL 100 MG PO TABS: 150.0000 mg | ORAL_TABLET | Freq: Every day | ORAL | 0 refills | Status: DC

## 2021-12-01 NOTE — Progress Notes (Signed)
[facility]  Patient ID: Virginia Bowen,  MRN: [id number]  PCP: Bjorn Pippin, MD  Date of Evaluation: 12/01/2021 Time spent:30 minutes  Chief Complaint:  Chief Complaint   Anxiety; Insomnia; Depression    HISTORY/CURRENT STATUS: For routine med check.   Gained a lot of weight in the past year, not sure why. Mirtazepine was started in April but she only takes it a few times a week.  No trouble falling asleep or staying asleep at night.  No snoring reported.  Not waking up gasping for air.  Not taking Hydroxyzine very often either. Is tired all the time. Had CPX a few months ago at [facility] physicians with nl labs. Is a little anemic but not bad. She was given iron at some point recently but could not tolerate it so she stopped it.  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  She has taken care of them since they were born.  Patient is able to enjoy things.  No extreme sadness, tearfulness, or feelings of hopelessness.  ADLs and personal hygiene are normal.   Denies any changes in concentration, making decisions, or remembering things.  Appetite has increased.  She tries not to eat unhealthy food but sometimes she does.  Not isolating.  Does get anxious off and on, more like being overwhelmed.  No panic attacks.  Hydroxyzine is effective when needed.  Denies suicidal or homicidal thoughts.  Patient denies increased energy with decreased need for sleep, increased talkativeness, racing thoughts, impulsivity or risky behaviors, increased spending, increased libido, grandiosity, increased irritability or anger, paranoia, or hallucinations.  Review of Systems  Constitutional:  Positive for malaise/fatigue.  HENT: Negative.    Eyes: Negative.   Respiratory: Negative.    Cardiovascular: Negative.   Gastrointestinal: Negative.   Genitourinary: Negative.   Musculoskeletal: Negative.   Skin: Negative.   Neurological: Negative.   Endo/Heme/Allergies:  Negative.   Psychiatric/Behavioral:         See HPI   Individual Medical History/ Review of Systems: Changes? :No      Past medications for mental health diagnoses include: Zoloft, Hydroxyzine, Trazodone, Melatonin  Allergies: Patient has no known allergies.  Current Medications:  Current Outpatient Medications:    hydrOXYzine (ATARAX) 10 MG tablet, Take 1-2 tablets (10-20 mg total) by mouth 3 (three) times daily as needed., Disp: 90 tablet, Rfl: 1   mirtazapine (REMERON) 15 MG tablet, TAKE 1/2 TO 1 (ONE-HALF TO ONE) TABLET BY MOUTH AT BEDTIME, Disp: 30 tablet, Rfl: 0   ondansetron (ZOFRAN) 4 MG tablet, 8 mg every 8 (eight) hours as needed., Disp: , Rfl: 0   sertraline (ZOLOFT) 100 MG tablet, Take 1.5 tablets (150 mg total) by mouth daily., Disp: 135 tablet, Rfl: 0 Medication Side Effects: weight gain probably from both mirtazapine and Zoloft.  Family Medical/ Social History: Changes?  No  MENTAL HEALTH EXAM:  There were no vitals taken for this visit.There is no height or weight on file to calculate BMI.  General Appearance: Casual, Neat and Well Groomed  Eye Contact:  Good  Speech:  Clear and Coherent and Normal Rate  Volume:  Normal  Mood:  Euthymic  Affect:  Congruent  Thought Process:  Goal Directed and Descriptions of Associations: Circumstantial  Orientation:  Full (Time, Place, and Person)  Thought Content: Logical   Suicidal Thoughts:  No  Homicidal Thoughts:  No  Memory:  WNL  Judgement:  Good  Insight:  Good  Psychomotor Activity:  Normal  Concentration:  Concentration: Good and Attention Span: Good  Recall:  Good  Fund of Knowledge: Good  Language: Good  Assets:  Desire for Improvement  ADL's:  Intact  Cognition: WNL  Prognosis:  Good   Pt showed me labs on her phone from [facility] 10/13/2021 CBC nl x Hgb 11.3, HCT 35.2 CMP Glu 86, Na 141 and all others nl Lipids all nl  DIAGNOSES:    ICD-10-CM   1. Recurrent major depressive disorder, in partial  remission ([place])  F33.41     2. Social anxiety disorder  F40.10     3. Malaise and fatigue  R53.81 TSH   R53.83 VITAMIN D 25 Hydroxy (Vit-D Deficiency, Fractures)    4. Encounter for long-term (current) use of medications  Z79.899 TSH    VITAMIN D 25 Hydroxy (Vit-D Deficiency, Fractures)      Receiving Psychotherapy: No   RECOMMENDATIONS:  PDMP was reviewed.  No results available. I provided 30 minutes of face to face time during this encounter, including time spent before and after the visit in records review, medical decision making, counseling pertinent to today's visit, and charting.   We discussed the fatigue.  I recommend checking a TSH and vitamin D level, those were not done at recent physical exam. We agree to decrease the Zoloft to see if that will help decrease appetite.  She knows to exercise at least 150 minutes/week and eat is healthy as possible with 5-7 fruits and vegetables per day.  For now, no other changes until labs are back.  Continue hydroxyzine 10 mg, 1-2 nightly as needed anxiety. Continue mirtazapine 15 mg, 1/2-1 nightly as needed. Decrease Zoloft 100 mg to 1.5 pills daily. Labs ordered as above. Return in 6 weeks.    Donnal Moat, PA-C

## 2021-12-02 ENCOUNTER — Other Ambulatory Visit: Payer: Self-pay | Admitting: Physician Assistant

## 2022-01-12 ENCOUNTER — Ambulatory Visit: Payer: Self-pay | Admitting: Physician Assistant

## 2022-02-13 ENCOUNTER — Other Ambulatory Visit: Payer: Self-pay | Admitting: Physician Assistant

## 2022-02-16 ENCOUNTER — Ambulatory Visit (INDEPENDENT_AMBULATORY_CARE_PROVIDER_SITE_OTHER): Payer: Medicaid Other

## 2022-02-16 ENCOUNTER — Ambulatory Visit (INDEPENDENT_AMBULATORY_CARE_PROVIDER_SITE_OTHER): Payer: Medicaid Other | Admitting: Physician Assistant

## 2022-02-16 ENCOUNTER — Encounter: Payer: Self-pay | Admitting: Physician Assistant

## 2022-02-16 DIAGNOSIS — M545 Low back pain, unspecified: Secondary | ICD-10-CM

## 2022-02-16 DIAGNOSIS — G8929 Other chronic pain: Secondary | ICD-10-CM

## 2022-02-16 DIAGNOSIS — M542 Cervicalgia: Secondary | ICD-10-CM

## 2022-02-16 MED ORDER — METHOCARBAMOL 500 MG PO TABS: 500.0000 mg | ORAL_TABLET | Freq: Every day | ORAL | 1 refills | Status: AC

## 2022-02-16 NOTE — Progress Notes (Signed)
Office Visit Note   Patient: Virginia Bowen           Date of Birth: 04/29/2000           MRN: [id number] Visit Date: 02/16/2022              Requested by: Almedia Balls, NP [address] [place],  [place] [address] PCP: Almedia Balls, NP   Assessment & Plan: Visit Diagnoses:  1. Chronic bilateral low back pain without sciatica   2. Neck pain     Plan: Will have her take Robaxin at night.  No operation of heavy equipment or perform activities that require full concentration while on Robaxin.  She will take her ibuprofen as needed.  Reluctant to place her on any other NSAIDs due to GI upset.  Will send her to formal physical therapy for her cervical spine and L-spine.  To work on range of motion, strengthening, include modalities and home exercise program.  She will follow-up with [place] in 4 weeks see how she is doing overall sooner if her condition becomes worse in any way.  Questions were encouraged and answered  Follow-Up Instructions: Return in about 4 weeks (around 03/16/2022).   Orders:  Orders Placed This Encounter  Procedures   XR Cervical Spine 2 or 3 views   XR Lumbar Spine 2-3 Views   Meds ordered this encounter  Medications   methocarbamol (ROBAXIN) 500 MG tablet    Sig: Take 1 tablet (500 mg total) by mouth at bedtime.    Dispense:  30 tablet    Refill:  1      Procedures: No procedures performed   Clinical Data: No additional findings.   Subjective: Chief Complaint  Patient presents with   Neck - Pain   Lower Back - Pain    HPI Patient is a 22 year old female seen for the first time today for neck and low back pain.  She states she has had neck and back pain for years.  No known injury.  She denies any radicular symptoms down the upper extremities or lower extremities.  She denies any bowel or bladder dysfunction, saddle anesthesia like symptoms, fevers, chills, or unintentional weight loss.  She does note that her neck and back both  awaken her at times.  Ranks her neck pain to be 6-7 out of 10 pain at worst.  Back pain is 6-7 out of 10 pain usually with menstrual cycle.  She has tried ibuprofen and heat.  Notes ibuprofen helps some. Review of Systems See HPI otherwise negative  Objective: Vital Signs: There were no vitals taken for this visit.  Physical Exam Constitutional:      Appearance: She is not ill-appearing or diaphoretic.  Cardiovascular:     Pulses: Normal pulses.  Pulmonary:     Effort: Pulmonary effort is normal.  Neurological:     Mental Status: She is alert and oriented to person, place, and time.  Psychiatric:        Mood and Affect: Mood normal.     Ortho Exam Cervical spine good range of motion cervical spine without pain.  Nontender about the cervical spinal column.  No tenderness along the medial borders of bilateral scapula.  Spurling's is negative.  Upper extremity she has 5 out of 5 strength throughout against resistance.  Bilateral hands subjective full sensation to light touch.  Full motor bilateral hands. Lower extremities: 5 out of 5 strength against resistance.  Excellent range of motion  bilateral hips without pain. Lumbar spine nontender over the paraspinous region lumbar spine and over the spinal column.  Negative straight leg raise bilaterally.  She has full forward flexion and extension of the lumbar spine without pain. Specialty Comments:  No specialty comments available.  Imaging: XR Cervical Spine 2 or 3 views  Result Date: 02/16/2022 Cervical spine: Slight loss of lordotic curvature.  Disc space overall well-maintained.  No spondylolisthesis.  No acute fractures or acute findings.  Normal bone density.  XR Lumbar Spine 2-3 Views  Result Date: 02/16/2022 Lumbar spine 2 views: No acute fractures.  Disc base overall well-maintained.  Mild lower lumbar facet changes.  No spondylolisthesis.    PMFS History: There are no problems to display for this patient.  History  reviewed. No pertinent past medical history.  History reviewed. No pertinent family history.  History reviewed. No pertinent surgical history. Social History   Occupational History   Not on file  Tobacco Use   Smoking status: Never   Smokeless tobacco: Never  Substance and Sexual Activity   Alcohol use: Never   Drug use: Never   Sexual activity: Not on file

## 2022-02-17 NOTE — Addendum Note (Signed)
Addended by: Elvin So L on: 02/17/2022 11:30 AM   Modules accepted: Orders

## 2022-02-23 ENCOUNTER — Encounter: Payer: Self-pay | Admitting: Physician Assistant

## 2022-02-23 ENCOUNTER — Ambulatory Visit (INDEPENDENT_AMBULATORY_CARE_PROVIDER_SITE_OTHER): Payer: Medicaid Other | Admitting: Physician Assistant

## 2022-02-23 DIAGNOSIS — R635 Abnormal weight gain: Secondary | ICD-10-CM

## 2022-02-23 DIAGNOSIS — F422 Mixed obsessional thoughts and acts: Secondary | ICD-10-CM

## 2022-02-23 DIAGNOSIS — F4010 Social phobia, unspecified: Secondary | ICD-10-CM

## 2022-02-23 DIAGNOSIS — F3341 Major depressive disorder, recurrent, in partial remission: Secondary | ICD-10-CM

## 2022-02-23 DIAGNOSIS — G4700 Insomnia, unspecified: Secondary | ICD-10-CM

## 2022-02-23 MED ORDER — SERTRALINE HCL 100 MG PO TABS: 100.0000 mg | ORAL_TABLET | Freq: Every day | ORAL | 0 refills | Status: DC

## 2022-02-23 NOTE — Progress Notes (Signed)
[facility]  Patient ID: Virginia Bowen,  MRN: [id number]  PCP: Almedia Balls, NP  Date of Evaluation: 02/23/2022 Time spent:20 minutes  Chief Complaint:  Chief Complaint   Anxiety; Depression; Insomnia; Follow-up    HISTORY/CURRENT STATUS: For routine med check.   Decreased Zoloft 12/01/2021. Not having any probs at lower dose. She wonders if decreasing further would be ok. Continues to gain wt, about 60# in 1-2 years. Not taking the Mirtazapine qhs, only occas. Sleeps ok most of the time. Increased wt is worsening of back pain.   Doesn't feel depressed. Patient is able to enjoy things when she feels like it.  Energy and motivation are fair to good, depending on the day.  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  No extreme sadness, tearfulness, or feelings of hopelessness.   ADLs and personal hygiene are normal.   Denies any changes in concentration, making decisions, or remembering things.  Appetite has not changed. Anxiety is well controlled with hydroxyzine, she usually takes it twice a day.  Denies suicidal or homicidal thoughts.  Patient denies increased energy with decreased need for sleep, increased talkativeness, racing thoughts, impulsivity or risky behaviors, increased spending, increased libido, grandiosity, increased irritability or anger, paranoia, or hallucinations.  Denies dizziness, syncope, seizures, numbness, tingling, tremor, tics, unsteady gait, slurred speech, confusion. Chronic back pain, starting PT soon.  No dystonia.  Individual Medical History/ Review of Systems: Changes? :No      Past medications for mental health diagnoses include: Zoloft, Hydroxyzine, Trazodone, Melatonin  Allergies: Patient has no known allergies.  Current Medications:  Current Outpatient Medications:    hydrOXYzine (ATARAX) 10 MG tablet, TAKE 1 TO 2 TABLETS BY MOUTH THREE TIMES DAILY AS NEEDED, Disp: 90 tablet, Rfl: 0   methocarbamol (ROBAXIN) 500 MG tablet, Take 1  tablet (500 mg total) by mouth at bedtime., Disp: 30 tablet, Rfl: 1   mirtazapine (REMERON) 15 MG tablet, TAKE 1/2 TO 1 (ONE-HALF TO ONE) TABLET BY MOUTH AT BEDTIME, Disp: 30 tablet, Rfl: 0   ondansetron (ZOFRAN) 4 MG tablet, 8 mg every 8 (eight) hours as needed., Disp: , Rfl: 0   sertraline (ZOLOFT) 100 MG tablet, Take 1 tablet (100 mg total) by mouth daily., Disp: 90 tablet, Rfl: 0 Medication Side Effects: weight gain probably from both mirtazapine and Zoloft.  Family Medical/ Social History: Changes?  No  MENTAL HEALTH EXAM:  There were no vitals taken for this visit.There is no height or weight on file to calculate BMI.  General Appearance: Casual, Neat, Well Groomed, and Obese  Eye Contact:  Good  Speech:  Clear and Coherent and Normal Rate  Volume:  Normal  Mood:  Euthymic  Affect:  Congruent  Thought Process:  Goal Directed and Descriptions of Associations: Circumstantial  Orientation:  Full (Time, Place, and Person)  Thought Content: Logical   Suicidal Thoughts:  No  Homicidal Thoughts:  No  Memory:  WNL  Judgement:  Good  Insight:  Good  Psychomotor Activity:  Normal  Concentration:  Concentration: Good and Attention Span: Good  Recall:  Good  Fund of Knowledge: Good  Language: Good  Assets:  Desire for Improvement  ADL's:  Intact  Cognition: WNL  Prognosis:  Good   DIAGNOSES:    ICD-10-CM   1. Recurrent major depressive disorder, in partial remission ([place])  F33.41     2. Weight gain  R63.5     3. Social anxiety disorder  F40.10     4. Mixed  obsessional thoughts and acts  F42.2     5. Insomnia, unspecified type  G47.00       Receiving Psychotherapy: No   RECOMMENDATIONS:  PDMP was reviewed.  No controlled substances. I provided 20 minutes of face to face time during this encounter, including time spent before and after the visit in records review, medical decision making, counseling pertinent to today's visit, and charting.   Discussed the weight gain.  It's likely from Zoloft and Mirtazipine, although not taking the latter often. It's fine to decrease the Zoloft dose and see how he does with anxiety, and whether it curbs appetite. I'm concerned if we stop it altogether, the anxiety and OCD will worsen.  Discussed Buspar, which we might try later. Also can take the hydroxyzine tid routinely if she would like and it's not making her sleepy.  She's seeing PCP about the weight gain. Labs were drawn this morning, pnd.  Continue hydroxyzine 10 mg, 1-2po three times daily as needed for anxiety. Continue mirtazapine 15 mg, 1/2-1 nightly as needed. Decrease Zoloft to 100 mg, 1 qd.  Return in 2-3 months.    Donnal Moat, PA-C

## 2022-04-04 ENCOUNTER — Other Ambulatory Visit: Payer: Self-pay | Admitting: Physician Assistant

## 2022-04-06 ENCOUNTER — Encounter: Payer: Self-pay | Admitting: Radiology

## 2022-05-02 ENCOUNTER — Telehealth: Payer: Self-pay | Admitting: Genetic Counselor

## 2022-05-02 ENCOUNTER — Encounter: Payer: Self-pay | Admitting: Genetic Counselor

## 2022-05-02 NOTE — Telephone Encounter (Signed)
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

## 2022-05-03 ENCOUNTER — Other Ambulatory Visit: Payer: Self-pay | Admitting: Physician Assistant

## 2022-05-11 ENCOUNTER — Inpatient Hospital Stay: Payer: Medicaid Other | Admitting: Genetic Counselor

## 2022-05-11 ENCOUNTER — Inpatient Hospital Stay: Payer: Medicaid Other | Attending: Nurse Practitioner | Admitting: Genetic Counselor

## 2022-05-11 ENCOUNTER — Encounter: Payer: Self-pay | Admitting: Genetic Counselor

## 2022-05-11 DIAGNOSIS — Z8481 Family history of carrier of genetic disease: Secondary | ICD-10-CM

## 2022-05-11 DIAGNOSIS — Z800 Family history of malignant neoplasm of digestive organs: Secondary | ICD-10-CM

## 2022-05-11 NOTE — Progress Notes (Signed)
REFERRING PROVIDER: Self-referred  PRIMARY PROVIDER:  Carilyn Goodpasture, NP  PRIMARY REASON FOR VISIT:  1. Family history of colon cancer   2. Family history of PMS2 gene mutation     HISTORY OF PRESENT ILLNESS:   Ms. Kerschner, a 22 y.o. female, was seen for a [place] cancer genetics consultation given her father's recent diagnosed of Lynch syndrome (PMS2 gene mutation).    Ms. Piske presents to clinic today  to discuss genetic testing and to further clarify her future cancer risks.   Ms. Diluzio is a 22 y.o. female with no personal history of cancer.    CANCER HISTORY:  Oncology History   No history exists.   RISK FACTORS:  Breast imaging within the last year: no Number of breast biopsies: 0. Colonoscopy: no; not examined. Hysterectomy: no.  Ovaries intact: yes.  Menarche was at age 70.  Nulliparous.  OCP use for approximately 0 years.   FAMILY HISTORY:  We obtained a detailed, 4-generation family history.  Significant diagnoses are listed below: Family History  Problem Relation Age of Onset   Colon cancer Father 69       PMS2 mutation   Lung cancer Paternal Aunt        father's mat half sister; smoking hx   Basal cell carcinoma Maternal Grandmother        95s   Colon cancer Maternal Grandmother        less than 10 lifetime   Leukemia Paternal Grandmother        dx 31s   Lung cancer Paternal Grandfather        dx 30s; smoking hx   Cancer Other        ovarian cancer in PGM's sister; colon cancer in PGM's mat half brother   Breast cancer Other        MGM's sister (dx after 24) and MGM's mat aunt (dx before 61)       Ms. Graziani's father had genetic testing through [facility] (48 genes), which showed a pathogenic variant in PMS2 called [id number] (p.P246Cfs*3).  The [facility]RNA[facility]ight Panel included analysis of the following 48 genes:  APC, ATM, BAP1, BARD1, BMPR1A, BRCA1, BRCA2, BRIP1, CDH1,  CDK4, CDKN2A, CHEK2, DICER1, FH, MEN1, MLH1, MSH2, MSH6, MUTYH, NF1, NTHL1, PALB2, PMS2, PTEN, RAD51C, RAD51D, SDHA, SDHB, SDHC, SDHD, SMAD4, SMARCA4, STK11, TP53, TSC1, TSC2 and VHL (sequencing and deletion/duplication); AXIN2, CTNNA1, EGFR, HOXB13, KIT, MSH3, PDGFRA, POLD1 and POLE (sequencing only); EPCAM and GREM1 (deletion/duplication only). RNA data is routinely analyzed for use in variant interpretation for all genes.  Ms. Plake's maternal great aunt with breast cancer reportedly had negative genetic testing.  There is no reported Ashkenazi Jewish ancestry. There is no known consanguinity.  GENETIC COUNSELING ASSESSMENT: Ms. Yantz is a 22 y.o. female with a family history of a PMS2 gene mutation in her father. We, therefore, discussed and recommended the following at today's visit.   DISCUSSION:  Given her father has a mutation in the PMS2 gene, she has a 50% chance of having the same family mutation in PMS2.  We reviewed the colon and uterine cancer risks and management strategies associated with PMS2 mutations. We discussed that testing is beneficial for several reasons, including knowing about cancer risks, identifying potential screening and risk-reduction options that may be appropriate, and to understanding if other family members could be at risk for cancer and allowing them to undergo genetic testing.  We reviewed the characteristics, features and inheritance patterns of hereditary  cancer syndromes. We also discussed genetic testing, including the appropriate family members to test, the process of testing, insurance coverage and turn-around-time for results. We discussed the implications of a negative and negative.  We recommend Ms. Macera pursue family variant testing for PMS2 [id number] (p.P246Cfs*3) through [facility]  We also offered panel testing, given her distant maternal family history of breast cancer.  Ms. Blessinger was most interested in analysis  of PMS2 only at this time.   Based on Ms. Auriemma's family history of  a known PMS2 gene mutation in the family she meets medical criteria for genetic testing.  She currently meets no charge genetic testing criteria through [facility] for the PMS2 family variant (within 90s days of her father's genetic testing report date).   We discussed the Genetic Information Non-Discrimination Act (GINA) of 2008, which helps protect individuals against genetic discrimination based on their genetic test results.  It impacts both health insurance and employment.  With health insurance, it protects against genetic test results being used for increased premiums or policy termination. For employment, it protects against hiring, firing and promoting decisions based on genetic test results.  GINA does not apply to those in the [employer], those who work for companies with less than 15 employees, and new life insurance or long-term disability insurance policies.  Health status due to a cancer diagnosis is not protected under GINA.  PLAN: After considering the risks, benefits, and limitations, Ms. Cedrone provided informed consent to pursue genetic testing.  A saliva kit was shipped to her home address with directions to mail to [facility] for site-specific analysis of the PMS2 gene mutation. Results should be available within approximately 3 weeks after sample collection/return. Ms. Lu will receive a summary of her genetic counseling visit and a copy of her results once available. This information will also be available in Epic.   Ms. Vignola's questions were answered to her satisfaction today. Our contact information was provided should additional questions or concerns arise.   Ifeoluwa Beller M. Rennie Plowman, MS, [facility] Genetic Counselor [email or web address] (P) [phone]   The patient was seen for less than 15 minutes of telephone genetic counseling.

## 2022-05-25 ENCOUNTER — Ambulatory Visit (INDEPENDENT_AMBULATORY_CARE_PROVIDER_SITE_OTHER): Payer: Medicaid Other | Admitting: Physician Assistant

## 2022-05-25 ENCOUNTER — Encounter: Payer: Self-pay | Admitting: Physician Assistant

## 2022-05-25 DIAGNOSIS — F3342 Major depressive disorder, recurrent, in full remission: Secondary | ICD-10-CM | POA: Diagnosis not present

## 2022-05-25 DIAGNOSIS — F4010 Social phobia, unspecified: Secondary | ICD-10-CM | POA: Diagnosis not present

## 2022-05-25 DIAGNOSIS — F422 Mixed obsessional thoughts and acts: Secondary | ICD-10-CM | POA: Diagnosis not present

## 2022-05-25 DIAGNOSIS — R635 Abnormal weight gain: Secondary | ICD-10-CM

## 2022-05-25 MED ORDER — SERTRALINE HCL 50 MG PO TABS: 75.0000 mg | ORAL_TABLET | Freq: Every day | ORAL | 0 refills | Status: DC

## 2022-05-25 NOTE — Progress Notes (Signed)
[facility]  Patient ID: Virginia Bowen,  MRN: [id number]  PCP: Carilyn Goodpasture, NP  Date of Evaluation: 05/25/2022 Time spent:20 minutes  Chief Complaint:  Chief Complaint   Follow-up    HISTORY/CURRENT STATUS: For routine med check.   Doesn't feel depressed. Patient is able to enjoy things when she feels like it.  Energy and motivation are good.  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  No extreme sadness, tearfulness, or feelings of hopelessness.   ADLs and personal hygiene are normal.   Denies any changes in concentration, making decisions, or remembering things. Anxiety is well controlled with hydroxyzine, she usually takes it twice a day.  Denies suicidal or homicidal thoughts.  Has still been gaining wt, around 60 pounds in the past few years.  Feels that it is related to the Zoloft.  She is nervous to go off of the Zoloft but she is not depressed and wonders if she even needs it now.  She does still have some social anxiety.  Not having panic attacks.  Hydroxyzine is very helpful.  She usually takes it 2 or 3 times a day.  It does not cause her to be sleepy.  Patient denies increased energy with decreased need for sleep, increased talkativeness, racing thoughts, impulsivity or risky behaviors, increased spending, increased libido, grandiosity, increased irritability or anger, paranoia, or hallucinations.  Denies dizziness, syncope, seizures, numbness, tingling, tremor, tics, unsteady gait, slurred speech, confusion. Chronic back pain.  No dystonia.  Individual Medical History/ Review of Systems: Changes? :No      Past medications for mental health diagnoses include: Zoloft, Hydroxyzine, Trazodone, Melatonin  Allergies: Patient has no known allergies.  Current Medications:  Current Outpatient Medications:    hydrOXYzine (ATARAX) 10 MG tablet, TAKE 1 TO 2 TABLETS BY MOUTH THREE TIMES DAILY AS NEEDED, Disp: 90 tablet, Rfl: 0   mirtazapine (REMERON) 15 MG  tablet, TAKE 1/2 TO 1 (ONE-HALF TO ONE) TABLET BY MOUTH AT BEDTIME, Disp: 30 tablet, Rfl: 0   sertraline (ZOLOFT) 50 MG tablet, Take 1.5 tablets (75 mg total) by mouth daily., Disp: 135 tablet, Rfl: 0   methocarbamol (ROBAXIN) 500 MG tablet, Take 1 tablet (500 mg total) by mouth at bedtime. (Patient not taking: Reported on 05/25/2022), Disp: 30 tablet, Rfl: 1   ondansetron (ZOFRAN) 4 MG tablet, 8 mg every 8 (eight) hours as needed. (Patient not taking: Reported on 05/25/2022), Disp: , Rfl: 0 Medication Side Effects: weight gain probably from both mirtazapine and Zoloft.  Family Medical/ Social History: Changes?  No  MENTAL HEALTH EXAM:  There were no vitals taken for this visit.There is no height or weight on file to calculate BMI.  General Appearance: Casual, Neat, Well Groomed, and Obese  Eye Contact:  Good  Speech:  Clear and Coherent and Normal Rate  Volume:  Normal  Mood:  Euthymic  Affect:  Congruent  Thought Process:  Goal Directed and Descriptions of Associations: Circumstantial  Orientation:  Full (Time, Place, and Person)  Thought Content: Logical   Suicidal Thoughts:  No  Homicidal Thoughts:  No  Memory:  WNL  Judgement:  Good  Insight:  Good  Psychomotor Activity:  Normal  Concentration:  Concentration: Good and Attention Span: Good  Recall:  Good  Fund of Knowledge: Good  Language: Good  Assets:  Desire for Improvement Financial Resources/Insurance Housing Transportation Vocational/Educational  ADL's:  Intact  Cognition: WNL  Prognosis:  Good   DIAGNOSES:    ICD-10-CM   1.  Social anxiety disorder  F40.10     2. Mixed obsessional thoughts and acts  F42.2     3. Weight gain  R63.5     4. Major depression, recurrent, full remission  F33.42       Receiving Psychotherapy: No   RECOMMENDATIONS:  PDMP was reviewed.  No controlled substances. I provided 20 minutes of face to face time during this encounter, including time spent before and after the visit in  records review, medical decision making, counseling pertinent to today's visit, and charting.   We discussed the weight gain.  SSRIs can definitely contribute to this problem.  She has seen her PCP for this, had labs that did not show any specific reason for the weight gain.  She has dieted, exercise, and nothing helps.  We decided to gradually decrease the Zoloft to see how she does, it may help at least stop further weight gain, but of course if depression recurs then we will need to increase it or try something else.  If anxiety worsens, consider BuSpar.  She and I did discuss this today so it can be called in prior to her next visit if needed.  She can increase the hydroxyzine if needed as well.  Continue hydroxyzine 10 mg, 1-2po three times daily as needed for anxiety. Continue mirtazapine 15 mg, 1/2-1 nightly as needed.  (She rarely takes.) Decrease Zoloft to 75 mg daily. Return in 3 months.    Melony Overly, PA-C

## 2022-06-06 ENCOUNTER — Encounter: Payer: Self-pay | Admitting: Genetic Counselor

## 2022-06-06 ENCOUNTER — Ambulatory Visit: Payer: Self-pay | Admitting: Genetic Counselor

## 2022-06-06 ENCOUNTER — Telehealth: Payer: Self-pay | Admitting: Genetic Counselor

## 2022-06-06 DIAGNOSIS — Z8481 Family history of carrier of genetic disease: Secondary | ICD-10-CM

## 2022-06-06 DIAGNOSIS — Z1379 Encounter for other screening for genetic and chromosomal anomalies: Secondary | ICD-10-CM

## 2022-06-06 DIAGNOSIS — Z800 Family history of malignant neoplasm of digestive organs: Secondary | ICD-10-CM

## 2022-06-06 NOTE — Telephone Encounter (Signed)
Discussed negative genetic testing for PMS2 family variant.

## 2022-06-13 NOTE — Progress Notes (Signed)
HPI:   Ms. Gryder was previously seen in the [facility] due to a family history of a known PMS2 gene mutation in her father and concerns regarding a hereditary predisposition to cancer.    Ms. Das's recent genetic test results were disclosed to her by telephone. These results and recommendations are discussed in more detail below.  CANCER HISTORY:  Oncology History   No history exists.    FAMILY HISTORY:  We obtained a detailed, 4-generation family history.  Significant diagnoses are listed below:      Family History  Problem Relation Age of Onset   Colon cancer Father 66        PMS2 mutation   Lung cancer Paternal Aunt          father's mat half sister; smoking hx   Basal cell carcinoma Maternal Grandmother          17s   Colon cancer Maternal Grandmother          less than 10 lifetime   Leukemia Paternal Grandmother          dx 28s   Lung cancer Paternal Grandfather          dx 30s; smoking hx   Cancer Other          ovarian cancer in PGM's sister; colon cancer in PGM's mat half brother   Breast cancer Other          MGM's sister (dx after 88) and MGM's mat aunt (dx before 37)           Ms. Osika's father had genetic testing through [facility] (48 genes), which showed a pathogenic variant in PMS2 called [id number] (p.P246Cfs*3).  The [facility]ight Panel included analysis of the following 48 genes:  APC, ATM, BAP1, BARD1, BMPR1A, BRCA1, BRCA2, BRIP1, CDH1, CDK4, CDKN2A, CHEK2, DICER1, FH, MEN1, MLH1, MSH2, MSH6, MUTYH, NF1, NTHL1, PALB2, PMS2, PTEN, RAD51C, RAD51D, SDHA, SDHB, SDHC, SDHD, SMAD4, SMARCA4, STK11, TP53, TSC1, TSC2 and VHL (sequencing and deletion/duplication); AXIN2, CTNNA1, EGFR, HOXB13, KIT, MSH3, PDGFRA, POLD1 and POLE (sequencing only); EPCAM and GREM1 (deletion/duplication only). RNA data is routinely analyzed for use in variant interpretation for all genes.    Ms. Salvi's maternal great aunt with breast cancer reportedly had negative genetic testing.   There is no reported Ashkenazi Jewish ancestry. There is no known consanguinity.  GENETIC TEST RESULTS:  Negative genetic testing. The known family variant in PMS2 at c.[id number] (p.P246Cfs*3) was not detected in Ms. Gago's sample.   The test report has been scanned into EPIC and is located under the Molecular Pathology section of the Results Review tab.  A portion of the result report is included below for reference. Genetic testing reported out on Jun 05, 2022.      We recommended Ms. Caris pursue testing for the familial hereditary cancer gene mutation called PMS2 [id number] (p.P246Cfs*3). Ms. Faeth's test was normal and did not reveal the familial mutation. We call this result a true negative result because a cancer-causing mutation was identified in Ms. Sublette's family, and she did not inherit it.  Given this negative result, Ms. Staron's chances of developing PMS2-related cancers are closely to that in the general population.   ADDITIONAL GENETIC TESTING:   There are other genes that are associated with increased cancer risk that can be analyzed (such as breast cancer genes given the maternal family history). Should Ms. Skarzynski wish to pursue additional genetic testing, we are happy to  discuss and coordinate this testing.   CANCER SCREENING RECOMMENDATIONS:  Ms. Creighton's test result is considered negative (normal).  The PMS2 gene mutation that was previously detected in her father was not detected in her sample.  Therefore, PMS2-related high risk screening is NOT indicated for Ms. Cordial based on this result.   An individual's cancer risk and medical management are not determined by genetic test results alone. Overall cancer risk assessment incorporates additional factors, including personal medical history, family history, and any  available genetic information that may result in a personalized plan for cancer prevention and surveillance. Therefore, it is recommended she continue to follow the cancer management and screening guidelines provided by her primary healthcare provider.  We discussed that while her father has Lynch syndrome, his colon cancer was sporadic (mismatch repair proteins were intact).  Due to her father's sporadic colon cancer, we discussed that she should begin colonoscopies no later than age 73 and repeat at least every 5 years, or as recommended by her GI provider.   RECOMMENDATIONS FOR FAMILY MEMBERS:   Since she did not inherit the known PMS2 gene mutation,  any of her potential future children cannot inherit the familial PMS2 gene mutation from her.  Other relatives of her father should have genetic counseling/testing, given the known PMS2 mutation, if they have not already.   FOLLOW-UP:  Cancer genetics is a rapidly advancing field and it is possible that new genetic tests will be appropriate for her and/or her family members in the future. We encourage Ms. Beer to remain in contact with cancer genetics, so we can update her personal and family histories and let her know of advances in cancer genetics that may benefit this family.   Our contact number was provided..She knows she is welcome to call us at anytime with additional questions or concerns.   Minah Axelrod M. Rennie Plowman, MS, [facility] Genetic Counselor [email or web address] (P) [phone]

## 2022-08-24 ENCOUNTER — Encounter: Payer: Self-pay | Admitting: Physician Assistant

## 2022-08-24 ENCOUNTER — Ambulatory Visit (INDEPENDENT_AMBULATORY_CARE_PROVIDER_SITE_OTHER): Payer: Medicaid Other | Admitting: Physician Assistant

## 2022-08-24 DIAGNOSIS — R635 Abnormal weight gain: Secondary | ICD-10-CM

## 2022-08-24 DIAGNOSIS — G4700 Insomnia, unspecified: Secondary | ICD-10-CM

## 2022-08-24 DIAGNOSIS — F4323 Adjustment disorder with mixed anxiety and depressed mood: Secondary | ICD-10-CM

## 2022-08-24 MED ORDER — HYDROXYZINE HCL 10 MG PO TABS: 10.0000 mg | ORAL_TABLET | Freq: Three times a day (TID) | ORAL | 1 refills | Status: AC | PRN

## 2022-08-24 MED ORDER — SERTRALINE HCL 25 MG PO TABS: 25.0000 mg | ORAL_TABLET | Freq: Every day | ORAL | 1 refills | Status: AC

## 2022-08-24 NOTE — Progress Notes (Signed)
[facility]  Patient ID: Virginia Bowen,  MRN: [id number]  PCP: Carilyn Goodpasture, NP  Date of Evaluation: 08/24/2022 Time spent: 21 mins  Chief Complaint:  Chief Complaint   Anxiety; Depression; Follow-up    HISTORY/CURRENT STATUS: For routine med check.   Mom to ER twice last week w/ chest pain, dad is also dealing with cancer recurrence, and GM has rectal cancer. 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 A lot of changes. Even with all this going on, she really wants to go off the Zoloft if she can. Has gained a lot of weight from it. Doesn't want to be on medicine forever. Gets anxious with all that's going on but is able to work through it. Hydroxyzine helps when needed.   Patient is able to enjoy things.  Energy and motivation are good.  No extreme sadness, tearfulness, or feelings of hopelessness.  Sleeps ok.  ADLs and personal hygiene are normal.   Denies any changes in concentration, making decisions, or remembering things.  Appetite has not changed. Denies laxative use, calorie restricting, or binging and purging.   Denies cutting or any form of self-harm.  Denies suicidal or homicidal thoughts.  Patient denies increased energy with decreased need for sleep, increased talkativeness, racing thoughts, impulsivity or risky behaviors, increased spending, increased libido, grandiosity, increased irritability or anger, paranoia, or hallucinations.  Denies dizziness, syncope, seizures, numbness, tingling, tremor, tics, unsteady gait, slurred speech, confusion. Chronic back pain.  No dystonia.  Individual Medical History/ Review of Systems: Changes? :No      Past medications for mental health diagnoses include: Zoloft, Hydroxyzine, Trazodone, Melatonin  Allergies: Patient has no known allergies.  Current Medications:  Current Outpatient Medications:    mirtazapine (REMERON) 15 MG tablet, TAKE 1/2 TO  1 (ONE-HALF TO ONE) TABLET BY MOUTH AT BEDTIME (Patient taking differently: Take 15 mg by mouth at bedtime as needed. Takes it rarely), Disp: 30 tablet, Rfl: 0   sertraline (ZOLOFT) 25 MG tablet, Take 1 tablet (25 mg total) by mouth daily., Disp: 30 tablet, Rfl: 1   hydrOXYzine (ATARAX) 10 MG tablet, Take 1 tablet (10 mg total) by mouth 3 (three) times daily as needed., Disp: 90 tablet, Rfl: 1   methocarbamol (ROBAXIN) 500 MG tablet, Take 1 tablet (500 mg total) by mouth at bedtime. (Patient not taking: Reported on 05/25/2022), Disp: 30 tablet, Rfl: 1   ondansetron (ZOFRAN) 4 MG tablet, 8 mg every 8 (eight) hours as needed. (Patient not taking: Reported on 05/25/2022), Disp: , Rfl: 0 Medication Side Effects: weight gain probably from both mirtazapine and Zoloft.  Family Medical/ Social History: Changes?  See HPI  MENTAL HEALTH EXAM:  There were no vitals taken for this visit.There is no height or weight on file to calculate BMI.  General Appearance: Casual, Neat, Well Groomed, and Obese  Eye Contact:  Good  Speech:  Clear and Coherent and Normal Rate  Volume:  Normal  Mood:  Euthymic  Affect:  Congruent  Thought Process:  Goal Directed and Descriptions of Associations: Circumstantial  Orientation:  Full (Time, Place, and Person)  Thought Content: Logical   Suicidal Thoughts:  No  Homicidal Thoughts:  No  Memory:  WNL  Judgement:  Good  Insight:  Good  Psychomotor Activity:  Normal  Concentration:  Concentration: Good and Attention Span: Good  Recall:  Good  Fund of Knowledge: Good  Language: Good  Assets:  Desire for Improvement Financial Resources/Insurance Housing Resilience Transportation Vocational/Educational  ADL's:  Intact  Cognition: WNL  Prognosis:  Good   DIAGNOSES:    ICD-10-CM   1. Situational mixed anxiety and depressive disorder  F43.23     2. Weight gain  R63.5     3. Insomnia, unspecified type  G47.00      Receiving Psychotherapy: No    RECOMMENDATIONS:  PDMP was reviewed.  No controlled substances. I provided 21 minutes of face to face time during this encounter, including time spent before and after the visit in records review, medical decision making, counseling pertinent to today's visit, and charting.   She prefers to wean off the Zoloft if possible. It's ok to try and f/u next month to see how it's going, Reminder that Zoloft not only helps depression but also anxiety.   Continue hydroxyzine 10 mg, 1-2po three times daily as needed for anxiety. Decrease Zoloft 25 mg to 1/2 every day for about 2 weeks and then can stop if she prefers. (Or stay on that dose)  Return in 6 weeks.     Melony Overly, PA-C

## 2022-10-05 ENCOUNTER — Ambulatory Visit: Payer: Medicaid Other | Admitting: Physician Assistant
